# Patient Record
Sex: Female | Born: 1935 | Race: White | Hispanic: No | Smoking: Never smoker
Health system: Southern US, Community
[De-identification: ages and names within clinical notes are randomized; demographics above are authoritative.]

## PROBLEM LIST (undated history)

## (undated) DIAGNOSIS — I10 Essential (primary) hypertension: Secondary | ICD-10-CM

## (undated) DIAGNOSIS — I509 Heart failure, unspecified: Secondary | ICD-10-CM

## (undated) DIAGNOSIS — F0390 Unspecified dementia without behavioral disturbance: Secondary | ICD-10-CM

## (undated) DIAGNOSIS — K5900 Constipation, unspecified: Secondary | ICD-10-CM

## (undated) DIAGNOSIS — I519 Heart disease, unspecified: Secondary | ICD-10-CM

## (undated) HISTORY — DX: Heart disease, unspecified: I51.9

---

## 2010-03-31 DIAGNOSIS — I10 Essential (primary) hypertension: Secondary | ICD-10-CM | POA: Insufficient documentation

## 2010-09-21 DIAGNOSIS — I701 Atherosclerosis of renal artery: Secondary | ICD-10-CM | POA: Insufficient documentation

## 2010-11-04 DIAGNOSIS — E785 Hyperlipidemia, unspecified: Secondary | ICD-10-CM | POA: Insufficient documentation

## 2010-12-17 DIAGNOSIS — R739 Hyperglycemia, unspecified: Secondary | ICD-10-CM | POA: Insufficient documentation

## 2011-10-25 DIAGNOSIS — Z1239 Encounter for other screening for malignant neoplasm of breast: Secondary | ICD-10-CM | POA: Insufficient documentation

## 2012-08-02 DIAGNOSIS — I25118 Atherosclerotic heart disease of native coronary artery with other forms of angina pectoris: Secondary | ICD-10-CM | POA: Insufficient documentation

## 2013-07-31 DIAGNOSIS — N189 Chronic kidney disease, unspecified: Secondary | ICD-10-CM | POA: Insufficient documentation

## 2013-07-31 DIAGNOSIS — E559 Vitamin D deficiency, unspecified: Secondary | ICD-10-CM | POA: Insufficient documentation

## 2014-09-11 DIAGNOSIS — A0472 Enterocolitis due to Clostridium difficile, not specified as recurrent: Secondary | ICD-10-CM | POA: Insufficient documentation

## 2014-11-19 DIAGNOSIS — Z86010 Personal history of colonic polyps: Secondary | ICD-10-CM | POA: Insufficient documentation

## 2015-04-04 DIAGNOSIS — I2699 Other pulmonary embolism without acute cor pulmonale: Secondary | ICD-10-CM | POA: Insufficient documentation

## 2016-12-19 DIAGNOSIS — R7989 Other specified abnormal findings of blood chemistry: Secondary | ICD-10-CM | POA: Insufficient documentation

## 2016-12-22 DIAGNOSIS — J9801 Acute bronchospasm: Secondary | ICD-10-CM | POA: Insufficient documentation

## 2017-07-28 DIAGNOSIS — F419 Anxiety disorder, unspecified: Secondary | ICD-10-CM | POA: Insufficient documentation

## 2017-10-12 DIAGNOSIS — R440 Auditory hallucinations: Secondary | ICD-10-CM | POA: Insufficient documentation

## 2017-11-02 DIAGNOSIS — D509 Iron deficiency anemia, unspecified: Secondary | ICD-10-CM | POA: Insufficient documentation

## 2017-11-02 DIAGNOSIS — N183 Chronic kidney disease, stage 3 unspecified: Secondary | ICD-10-CM | POA: Insufficient documentation

## 2019-12-17 DIAGNOSIS — R609 Edema, unspecified: Secondary | ICD-10-CM | POA: Insufficient documentation

## 2019-12-17 DIAGNOSIS — R600 Localized edema: Secondary | ICD-10-CM | POA: Insufficient documentation

## 2019-12-17 DIAGNOSIS — R0609 Other forms of dyspnea: Secondary | ICD-10-CM | POA: Insufficient documentation

## 2020-02-14 DIAGNOSIS — I509 Heart failure, unspecified: Secondary | ICD-10-CM | POA: Insufficient documentation

## 2020-02-26 DIAGNOSIS — R197 Diarrhea, unspecified: Secondary | ICD-10-CM | POA: Insufficient documentation

## 2020-07-15 DIAGNOSIS — K52832 Lymphocytic colitis: Secondary | ICD-10-CM | POA: Insufficient documentation

## 2020-07-15 DIAGNOSIS — D6859 Other primary thrombophilia: Secondary | ICD-10-CM | POA: Insufficient documentation

## 2020-12-23 DIAGNOSIS — Z7409 Other reduced mobility: Secondary | ICD-10-CM | POA: Insufficient documentation

## 2020-12-27 DIAGNOSIS — R0902 Hypoxemia: Secondary | ICD-10-CM | POA: Insufficient documentation

## 2020-12-27 DIAGNOSIS — K602 Anal fissure, unspecified: Secondary | ICD-10-CM | POA: Insufficient documentation

## 2020-12-27 DIAGNOSIS — K5641 Fecal impaction: Secondary | ICD-10-CM | POA: Insufficient documentation

## 2021-03-21 ENCOUNTER — Encounter (HOSPITAL_COMMUNITY): Payer: Self-pay | Admitting: Emergency Medicine

## 2021-03-21 ENCOUNTER — Emergency Department (HOSPITAL_COMMUNITY)
Admission: EM | Admit: 2021-03-21 | Discharge: 2021-03-21 | Disposition: A | Discharge status: Home / Self Care | Payer: Medicare Other | Attending: Emergency Medicine | Admitting: Emergency Medicine

## 2021-03-21 ENCOUNTER — Other Ambulatory Visit: Payer: Self-pay

## 2021-03-21 ENCOUNTER — Emergency Department (HOSPITAL_COMMUNITY): Payer: Medicare Other

## 2021-03-21 DIAGNOSIS — Z79899 Other long term (current) drug therapy: Secondary | ICD-10-CM | POA: Insufficient documentation

## 2021-03-21 DIAGNOSIS — R001 Bradycardia, unspecified: Secondary | ICD-10-CM | POA: Diagnosis not present

## 2021-03-21 DIAGNOSIS — I110 Hypertensive heart disease with heart failure: Secondary | ICD-10-CM | POA: Insufficient documentation

## 2021-03-21 DIAGNOSIS — Y9289 Other specified places as the place of occurrence of the external cause: Secondary | ICD-10-CM | POA: Insufficient documentation

## 2021-03-21 DIAGNOSIS — S42214A Unspecified nondisplaced fracture of surgical neck of right humerus, initial encounter for closed fracture: Secondary | ICD-10-CM | POA: Diagnosis not present

## 2021-03-21 DIAGNOSIS — F0390 Unspecified dementia without behavioral disturbance: Secondary | ICD-10-CM | POA: Diagnosis not present

## 2021-03-21 DIAGNOSIS — W19XXXA Unspecified fall, initial encounter: Secondary | ICD-10-CM | POA: Diagnosis not present

## 2021-03-21 DIAGNOSIS — I509 Heart failure, unspecified: Secondary | ICD-10-CM | POA: Diagnosis not present

## 2021-03-21 DIAGNOSIS — S4991XA Unspecified injury of right shoulder and upper arm, initial encounter: Secondary | ICD-10-CM | POA: Diagnosis present

## 2021-03-21 HISTORY — DX: Essential (primary) hypertension: I10

## 2021-03-21 HISTORY — DX: Unspecified dementia, unspecified severity, without behavioral disturbance, psychotic disturbance, mood disturbance, and anxiety: F03.90

## 2021-03-21 HISTORY — DX: Heart failure, unspecified: I50.9

## 2021-03-21 HISTORY — DX: Constipation, unspecified: K59.00

## 2021-03-21 LAB — CBC WITH DIFFERENTIAL/PLATELET
Abs Immature Granulocytes: 0.05 10*3/uL (ref 0.00–0.07)
Basophils Absolute: 0.1 10*3/uL (ref 0.0–0.1)
Basophils Relative: 1 %
Eosinophils Absolute: 0.1 10*3/uL (ref 0.0–0.5)
Eosinophils Relative: 1 %
HCT: 40.3 % (ref 36.0–46.0)
Hemoglobin: 12.8 g/dL (ref 12.0–15.0)
Immature Granulocytes: 1 %
Lymphocytes Relative: 13 %
Lymphs Abs: 1.4 10*3/uL (ref 0.7–4.0)
MCH: 27.8 pg (ref 26.0–34.0)
MCHC: 31.8 g/dL (ref 30.0–36.0)
MCV: 87.4 fL (ref 80.0–100.0)
Monocytes Absolute: 0.7 10*3/uL (ref 0.1–1.0)
Monocytes Relative: 7 %
Neutro Abs: 8.2 10*3/uL — ABNORMAL HIGH (ref 1.7–7.7)
Neutrophils Relative %: 77 %
Platelets: 219 10*3/uL (ref 150–400)
RBC: 4.61 MIL/uL (ref 3.87–5.11)
RDW: 15.3 % (ref 11.5–15.5)
WBC: 10.5 10*3/uL (ref 4.0–10.5)
nRBC: 0 % (ref 0.0–0.2)

## 2021-03-21 LAB — URINALYSIS, ROUTINE W REFLEX MICROSCOPIC
Bilirubin Urine: NEGATIVE
Glucose, UA: NEGATIVE mg/dL
Hgb urine dipstick: NEGATIVE
Ketones, ur: NEGATIVE mg/dL
Leukocytes,Ua: NEGATIVE
Nitrite: NEGATIVE
Protein, ur: 30 mg/dL — AB
Specific Gravity, Urine: 1.006 (ref 1.005–1.030)
pH: 7 (ref 5.0–8.0)

## 2021-03-21 LAB — COMPREHENSIVE METABOLIC PANEL
ALT: 25 U/L (ref 0–44)
AST: 69 U/L — ABNORMAL HIGH (ref 15–41)
Albumin: 3.6 g/dL (ref 3.5–5.0)
Alkaline Phosphatase: 59 U/L (ref 38–126)
Anion gap: 7 (ref 5–15)
BUN: 21 mg/dL (ref 8–23)
CO2: 32 mmol/L (ref 22–32)
Calcium: 8.8 mg/dL — ABNORMAL LOW (ref 8.9–10.3)
Chloride: 101 mmol/L (ref 98–111)
Creatinine, Ser: 1 mg/dL (ref 0.44–1.00)
GFR, Estimated: 53 mL/min — ABNORMAL LOW (ref >60)
Glucose, Bld: 156 mg/dL — ABNORMAL HIGH (ref 70–99)
Potassium: 3.6 mmol/L (ref 3.5–5.1)
Sodium: 140 mmol/L (ref 135–145)
Total Bilirubin: 0.3 mg/dL (ref 0.3–1.2)
Total Protein: 6.7 g/dL (ref 6.5–8.1)

## 2021-03-21 LAB — PROTIME-INR
INR: 1 (ref 0.8–1.2)
Prothrombin Time: 12.8 seconds (ref 11.4–15.2)

## 2021-03-21 LAB — APTT: aPTT: 26 seconds (ref 24–36)

## 2021-03-21 LAB — CBG MONITORING, ED: Glucose-Capillary: 154 mg/dL — ABNORMAL HIGH (ref 70–99)

## 2021-03-21 MED ORDER — HYDROCODONE-ACETAMINOPHEN 5-325 MG PO TABS: 1.0000 | ORAL_TABLET | Freq: Four times a day (QID) | ORAL | 0 refills | Status: DC | PRN

## 2021-03-21 MED ORDER — FENTANYL CITRATE (PF) 100 MCG/2ML IJ SOLN
25.0000 ug | Freq: Once | INTRAMUSCULAR | Status: AC
  Administered 2021-03-21: 25 ug via INTRAMUSCULAR
  Filled 2021-03-21: qty 2

## 2021-03-21 MED ORDER — FENTANYL CITRATE (PF) 100 MCG/2ML IJ SOLN: 25.0000 ug | Freq: Once | INTRAMUSCULAR | Status: DC

## 2021-03-21 NOTE — ED Triage Notes (Signed)
Patient brought in by son. Per son patient fell today while getting out of shower and is c/o right shoulder pain. Denies hitting head or LOC but does take anticoagulants. No pain noted with palpation of humerus or ulna/radius. Pain noted with ROM, patient unable to lift arm.

## 2021-03-21 NOTE — ED Provider Notes (Signed)
[facility] EMERGENCY DEPARTMENT Provider Note   CSN: [id number] Arrival date & time: 03/21/21  1021     History Chief Complaint  Patient presents with   Marletta Lor    Terri Gonzales is a 87 y.o. female.  HPI  87 year old female with a history of dementia, hypertension, CHF, CKD, colitis, HLD, anemia, PE, constipation, who presents to the emergency department today for evaluation of a fall.  She is here with her son who provides the history.  He states that the patient is staying with him for the next 10 days.  She did lives in [place] with his sibling however they are on vacation.  Patient was in the shower earlier today when they heard a loud noise.  They found her in the shower and she apparently had fallen.  The patient reportedly has a history of frequent falls but she usually gets back up on her own and does not have any significant injuries.  At this time she is complaining of right shoulder pain and that is her only complaint.  She is had no recent illnesses including no fevers, vomiting, diarrhea, cough or other concerns from the family.  They do state that she is anticoagulated but they are not sure what medication she is on.  Son states she is at her mental baseline currently   Past Medical History:  Diagnosis Date   CHF (congestive heart failure) (HCC)    Constipation    Dementia (HCC)    Hypertension     There are no problems to display for this patient.   History reviewed. No pertinent surgical history.   OB History     Gravida      Para      Term      Preterm      AB      Living  2      SAB      IAB      Ectopic      Multiple      Live Births              History reviewed. No pertinent family history.  Social History   Tobacco Use   Smoking status: Never   Smokeless tobacco: Never  Vaping Use   Vaping Use: Never used  Substance Use Topics   Alcohol use: Never   Drug use: Never    Home Medications Prior to Admission  medications   Medication Sig Start Date End Date Taking? Authorizing Provider  amLODipine (NORVASC) 10 MG tablet Take 1 tablet by mouth daily. 02/12/21  Yes [provider]  ARIPiprazole (ABILIFY) 30 MG tablet Take 30 mg by mouth daily. 03/16/21  Yes [provider]  atorvastatin (LIPITOR) 40 MG tablet Take 1 tablet by mouth at bedtime. 02/12/21  Yes [provider]  budesonide (ENTOCORT EC) 3 MG 24 hr capsule Take 6 mg by mouth every morning. 01/05/21  Yes [provider]  docusate sodium (COLACE) 100 MG capsule Take 100 mg by mouth 2 (two) times daily. 03/03/21  Yes [provider]  escitalopram (LEXAPRO) 10 MG tablet Take 1 tablet by mouth daily. 02/12/21  Yes [provider]  furosemide (LASIX) 20 MG tablet Take 20 mg by mouth daily. 02/10/21  Yes [provider]  HYDROcodone-acetaminophen (NORCO/VICODIN) 5-325 MG tablet Take 1 tablet by mouth every 6 (six) hours as needed. 03/21/21  Yes Johanne Mcglade S, PA-C  lisinopril (ZESTRIL) 20 MG tablet Take 1 tablet by mouth 2 (  two) times daily. 02/12/21  Yes [provider]  Melatonin 10 MG TABS Take 1 capsule by mouth at bedtime.   Yes [provider]  Multiple Vitamin (QUINTABS) TABS Take 1 tablet by mouth daily.   Yes [provider]  pantoprazole (PROTONIX) 40 MG tablet Take 1 tablet by mouth daily. 03/03/21  Yes [provider]  Probiotic Product (PHILLIPS COLON HEALTH) CAPS Take 1 capsule by mouth daily. 08/02/12  Yes [provider]    Allergies    Patient has no known allergies.  Review of Systems   Review of Systems  Unable to perform ROS: Dementia  Musculoskeletal:        Right shoulder pain   Physical Exam Updated Vital Signs BP (!) 209/54   Pulse (!) 54   Temp 98 F (36.7 C) (Oral)   Resp 16   Ht 5' 1.5" (1.562 m)   Wt 52.2 kg   SpO2 96%   BMI 21.38 kg/m   Physical Exam Vitals and nursing note reviewed.  Constitutional:       General: She is not in acute distress.    Appearance: She is well-developed.  HENT:     Head: Normocephalic and atraumatic.  Eyes:     Conjunctiva/sclera: Conjunctivae normal.  Cardiovascular:     Rate and Rhythm: Normal rate and regular rhythm.     Heart sounds: Normal heart sounds. No murmur heard. Pulmonary:     Effort: Pulmonary effort is normal. No respiratory distress.     Breath sounds: Normal breath sounds. No wheezing, rhonchi or rales.  Abdominal:     General: Bowel sounds are normal.     Palpations: Abdomen is soft.     Tenderness: There is no abdominal tenderness.  Musculoskeletal:     Cervical back: Neck supple.     Comments: Trace ble edema. No pain with active/passive rom of the ble. No ttp to the bilat hips. TTP noted to the right shoulder and proximal humerus. Distal pulses are strong and sensation is intact. No midline ttp to the ctl spine.  Skin:    General: Skin is warm and dry.  Neurological:     Mental Status: She is alert.     Comments: Alert, demented. Oriented to self. Cranial nerves II-XI intact. Moving all extremities. Able to follow commands    ED Results / Procedures / Treatments   Labs (all labs ordered are listed, but only abnormal results are displayed) Labs Reviewed  CBC WITH DIFFERENTIAL/PLATELET - Abnormal; Notable for the following components:      Result Value   Neutro Abs 8.2 (*)    All other components within normal limits  COMPREHENSIVE METABOLIC PANEL - Abnormal; Notable for the following components:   Glucose, Bld 156 (*)    Calcium 8.8 (*)    AST 69 (*)    GFR, Estimated 53 (*)    All other components within normal limits  URINALYSIS, ROUTINE W REFLEX MICROSCOPIC - Abnormal; Notable for the following components:   Color, Urine STRAW (*)    Protein, ur 30 (*)    Bacteria, UA RARE (*)    All other components within normal limits  CBG MONITORING, ED - Abnormal; Notable for the following components:   Glucose-Capillary 154 (*)     All other components within normal limits  PROTIME-INR  APTT    EKG EKG Interpretation  Date/Time:  Saturday March 21 2021 11:47:49 EDT Ventricular Rate:  56 PR Interval:    QRS Duration:  94 QT Interval:  487 QTC Calculation: 470 R Axis:   49 Text Interpretation: Sinus bradycardia Abnormal R-wave progression, early transition Consider left ventricular hypertrophy No old tracing to compare Confirmed by Eber HongMiller, Brian ([id number]) on 03/21/2021 12:08:28 PM  Radiology DG Shoulder Right  Result Date: 03/21/2021 CLINICAL DATA:  Status post fall. EXAM: RIGHT SHOULDER - 2+ VIEW COMPARISON:  None. FINDINGS: No definite fracture line is visualized, however there is suspected impacted fracture of the right humeral neck. IMPRESSION: 1. Possible impacted fracture of the right humeral neck. If confirmed by point tenderness, CT of the right shoulder may be considered. Electronically Signed   By: Ted Mcalpineobrinka  Dimitrova M.D.   On: 03/21/2021 12:50   CT Head Wo Contrast  Result Date: 03/21/2021 CLINICAL DATA:  Head trauma.  Recent fall. EXAM: CT HEAD WITHOUT CONTRAST TECHNIQUE: Contiguous axial images were obtained from the base of the skull through the vertex without intravenous contrast. COMPARISON:  None. FINDINGS: Brain: No evidence of acute infarction, hemorrhage, hydrocephalus, extra-axial collection or mass lesion/mass effect. Mild low-density in the white matter is suggestive for chronic changes. Age appropriate atrophy. Vascular: No hyperdense vessel or unexpected calcification. Skull: Normal. Negative for fracture or focal lesion. Sinuses/Orbits: Mastoid air cells are aerated. Small amount of mucosal disease in the right sphenoid sinus. Other: None. IMPRESSION: 1. No acute intracranial abnormality. 2. Small amount of low-density in the white matter. Findings are suggestive for chronic small vessel ischemic changes. 3. Mild disease in the right sphenoid sinus. Electronically Signed   By: Richarda OverlieAdam  Henn M.D.    On: 03/21/2021 12:39    Procedures Procedures   Medications Ordered in ED Medications  fentaNYL (SUBLIMAZE) injection 25 mcg (25 mcg Intramuscular Given 03/21/21 1225)    ED Course  I have reviewed the triage vital signs and the nursing notes.  Pertinent labs & imaging results that were available during my care of the patient were reviewed by me and considered in my medical decision making (see chart for details).    MDM Rules/Calculators/A&P                          87 y/o female with hx dementia presenting for eval of unwitnessed fall  Reviewed/interpreted labs CBC unremarkable CMP with mildly elevated ast, otherwise reassuring Coags wnl UA with mild proteinuria, 6-10 wbc and rare bacteria. Low suspicion for uti  EKG - Sinus bradycardia Abnormal R-wave progression, early transition Consider left ventricular hypertrophy No old tracing to compare   Reviewed/interpreted imaging CT head - . No acute intracranial abnormality. 2. Small amount of low-density in the white matter. Findings are suggestive for chronic small vessel ischemic changes. 3. Mild disease in the right sphenoid sinus.  Xray right shoulder - likely impacted fracture of the right humeral neck.   - shoulder immobilization with sling placed  Pt with mechanical fall with right humeral neck fx. No other injuries and lab work/ekg is reassuring. Appears stable for d/c. Discussed results with son at bedside.  Discussed that I will give her referral to local orthopedic doctor while she is in town if he prefers to have her follow-up here however if he prefers to have her follow-up in her [place] then he will need to coordinate following up the patient's primary care provider and get a new referral to an orthopedic doctor there that she does not already have 1.  Will give Rx for pain medications.  Advised on return precautions.  He voices understanding of plan and reasons to return.  Questions answered.   Patient stable for discharge.  Final Clinical Impression(s) / ED Diagnoses Final diagnoses:  Fall, initial encounter  Closed nondisplaced fracture of surgical neck of right humerus, unspecified fracture morphology, initial encounter    Rx / DC Orders ED Discharge Orders          Ordered    HYDROcodone-acetaminophen (NORCO/VICODIN) 5-325 MG tablet  Every 6 hours PRN        03/21/21 1415             Quinn Quam S, PA-C 03/21/21 1419    Eber Hong, MD 03/22/21 [id number]

## 2021-03-21 NOTE — Discharge Instructions (Addendum)
Prescription given for norco. Take medication as directed and do not operate machinery, drive a car, or work while taking this medication as it can make you drowsy.   The patient was given a referral to an orthopedic doctor located locally here and in the [place] area.  She may follow-up with this orthopedic doctor or, if preferred you can call the patient's primary physician or her orthopedic physician at home to have her be scheduled for a follow-up appointment within the next 1 to 2 weeks for further treatment of her fracture.  Please return to the emergency department for any new or worsening symptoms in the meantime.

## 2021-03-21 NOTE — ED Triage Notes (Signed)
Medical screening done prior to patient being triaged.

## 2021-03-21 NOTE — ED Triage Notes (Signed)
EDPA in room performing medical screening. Son in room, calling to get patient's allergies and hx from sister.

## 2021-03-25 ENCOUNTER — Ambulatory Visit (INDEPENDENT_AMBULATORY_CARE_PROVIDER_SITE_OTHER): Payer: Medicare Other | Admitting: Orthopedic Surgery

## 2021-03-25 ENCOUNTER — Other Ambulatory Visit: Payer: Self-pay

## 2021-03-25 ENCOUNTER — Encounter: Payer: Self-pay | Admitting: Orthopedic Surgery

## 2021-03-25 VITALS — BP 200/62 | HR 60 | Ht 61.0 in | Wt 118.4 lb

## 2021-03-25 DIAGNOSIS — S42294A Other nondisplaced fracture of upper end of right humerus, initial encounter for closed fracture: Secondary | ICD-10-CM

## 2021-03-25 MED ORDER — HYDROCODONE-ACETAMINOPHEN 5-325 MG PO TABS: 1.0000 | ORAL_TABLET | Freq: Four times a day (QID) | ORAL | 0 refills | Status: AC | PRN

## 2021-03-25 NOTE — Progress Notes (Signed)
NEW PROBLEM//OFFICE VISIT  (Note no fracture care was charged as the patient is traveling to [place] and will be seen by another orthopedist  Summary assessment and plan:   87 year old nondisplaced transverse fracture surgical neck right humerus  Sling 3 weeks then therapy after x-ray  Chief Complaint  Patient presents with   New Patient (Initial Visit)   Shoulder Injury    Fx rt shoulder   HPI   MEDICAL DECISION MAKING  A.  Encounter Diagnosis  Name Primary?   Other closed nondisplaced fracture of proximal end of right humerus, initial encounter Yes    B. DATA ANALYSED:   IMAGING: Interpretation of images: Images were taken at [facility] on June 18  I am interpreting this as a nondisplaced proximal humerus fracture at the surgical neck  Orders: No new orders  Outside records reviewed: ER records   C. MANAGEMENT   Nonoperative management  Meds ordered this encounter  Medications   HYDROcodone-acetaminophen (NORCO/VICODIN) 5-325 MG tablet    Sig: Take 1 tablet by mouth every 6 (six) hours as needed for up to 5 days for moderate pain.    Dispense:  20 tablet    Refill:  0     BP (!) 200/62   Pulse 60   Ht 5\' 1"  (1.549 m)   Wt 118 lb 6.4 oz (53.7 kg)   BMI 22.37 kg/m    General appearance: Well-developed well-nourished no gross deformities  Cardiovascular normal pulse and perfusion normal color without edema  Neurologically no sensation loss or deficits or pathologic reflexes  Psychological: Awake alert and disoriented x3 mood and affect normal  Skin no lacerations or ulcerations no nodularity no palpable masses, no erythema or nodularity  Musculoskeletal: Right shoulder mild tenderness some ecchymosis moves the hand okay neurovascular exam intact   ROS Constipation no numbness or tingling  Past Medical History:  Diagnosis Date   CHF (congestive heart failure) (HCC)    Constipation    Dementia (HCC)    Heart disease     Hypertension     No past surgical history on file.  No family history on file. Social History   Tobacco Use   Smoking status: Never   Smokeless tobacco: Never  Vaping Use   Vaping Use: Never used  Substance Use Topics   Alcohol use: Never   Drug use: Never    No Known Allergies  Current Meds  Medication Sig   amLODipine (NORVASC) 10 MG tablet Take 1 tablet by mouth daily.   ARIPiprazole (ABILIFY) 30 MG tablet Take 30 mg by mouth daily.   atorvastatin (LIPITOR) 40 MG tablet Take 1 tablet by mouth at bedtime.   budesonide (ENTOCORT EC) 3 MG 24 hr capsule Take 6 mg by mouth every morning.   docusate sodium (COLACE) 100 MG capsule Take 100 mg by mouth 2 (two) times daily.   escitalopram (LEXAPRO) 10 MG tablet Take 1 tablet by mouth daily.   furosemide (LASIX) 20 MG tablet Take 20 mg by mouth daily.   HYDROcodone-acetaminophen (NORCO/VICODIN) 5-325 MG tablet Take 1 tablet by mouth every 6 (six) hours as needed for up to 5 days for moderate pain.   lisinopril (ZESTRIL) 20 MG tablet Take 1 tablet by mouth 2 (two) times daily.   Melatonin 10 MG TABS Take 1 capsule by mouth at bedtime.   Multiple Vitamin (QUINTABS) TABS Take 1 tablet by mouth daily.   pantoprazole (PROTONIX) 40 MG tablet Take 1 tablet by mouth daily.  Probiotic Product (PHILLIPS COLON HEALTH) CAPS Take 1 capsule by mouth daily.   [DISCONTINUED] HYDROcodone-acetaminophen (NORCO/VICODIN) 5-325 MG tablet Take 1 tablet by mouth every 6 (six) hours as needed.        Fuller Canada, MD  03/25/2021 10:39 AM

## 2021-03-25 NOTE — Patient Instructions (Addendum)
Recommended treatment  See orthopedic surgeon in 2 weeks for x-ray  Normal treatment is 2 to 3 weeks in a sling and then physical therapy  Take the hydrocodone at night use extra strength Tylenol during the day

## 2022-08-23 IMAGING — CT CT HEAD W/O CM
3 series · 15 of 47 positions shown, 18 images · non-contrast
Comparison: None.

CLINICAL DATA: Head trauma.  Recent fall.

EXAM:
CT HEAD WITHOUT CONTRAST
TECHNIQUE: Contiguous axial images were obtained from the base of the skull
through the vertex without intravenous contrast.

[Series 2: head w o · axial · 0.44mm/px · z∈[+194,+324]mm · 9 of 32 slices shown, 12 images]
[im 3/32  brain]
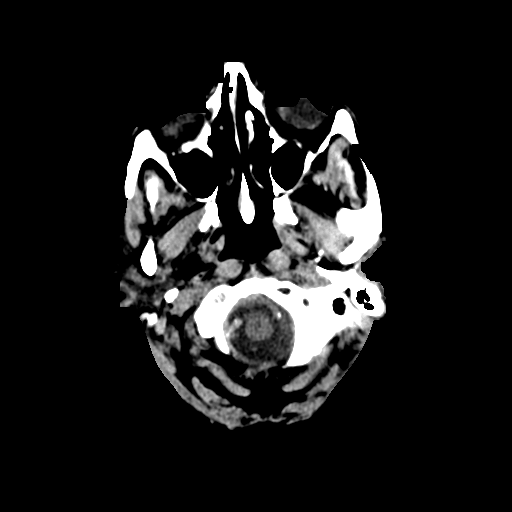
[im 3/32  bone]
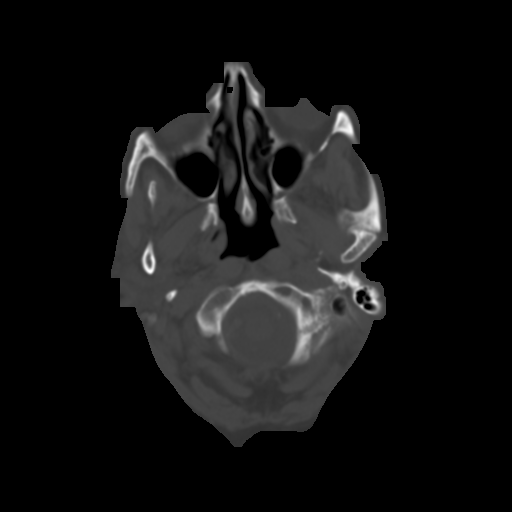
[im 6/32  brain]
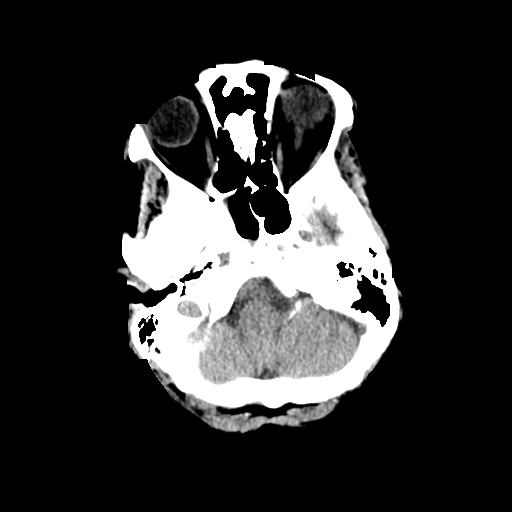
[im 9/32  brain]
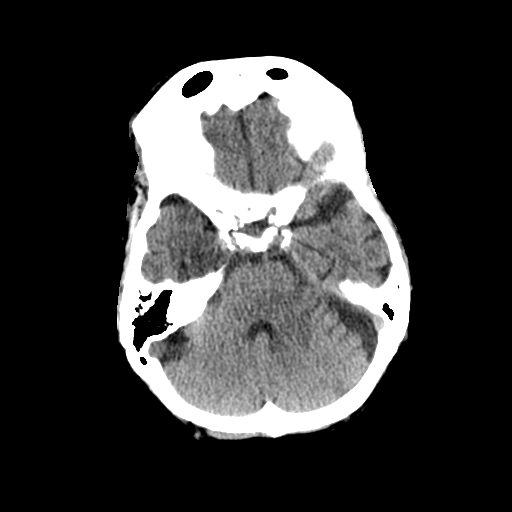
[im 12/32  brain]
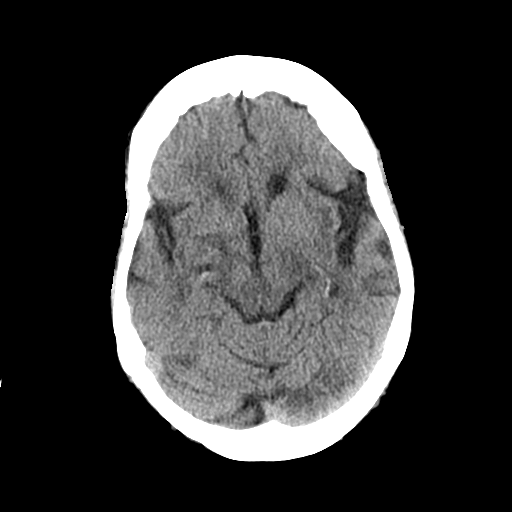
[im 17/32  brain]
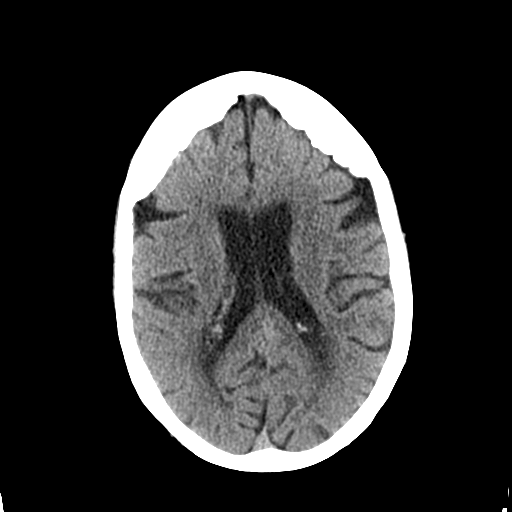
[im 17/32  bone]
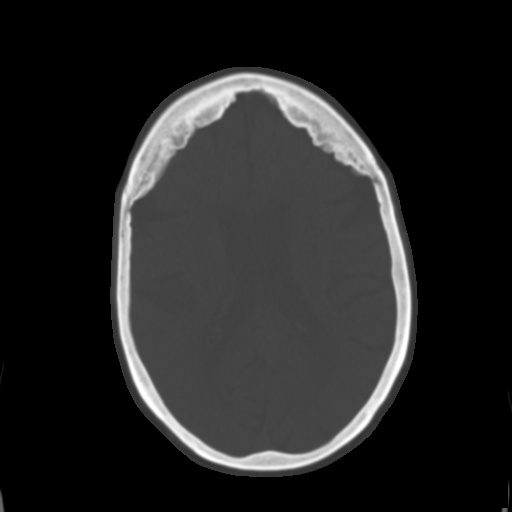
[im 20/32  brain]
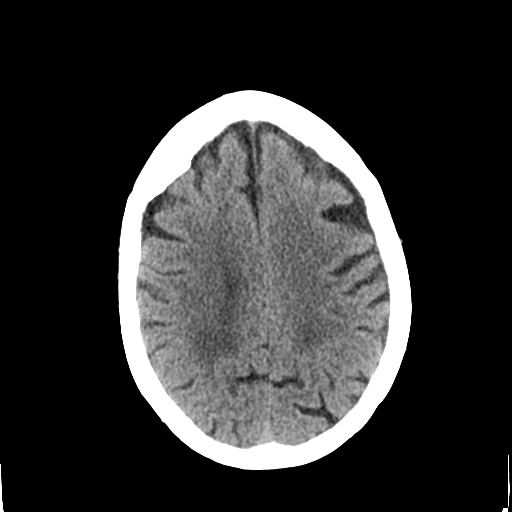
[im 23/32  brain]
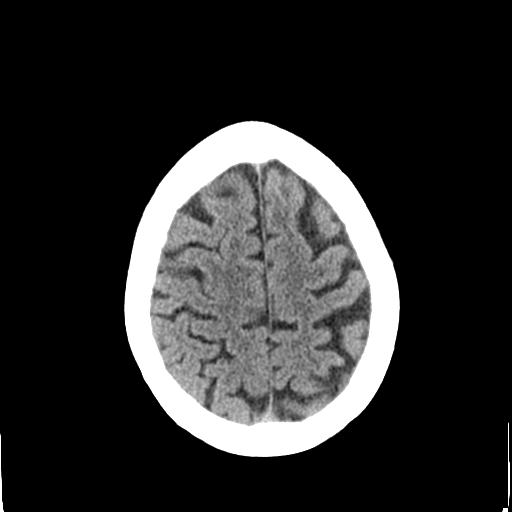
[im 26/32  brain]
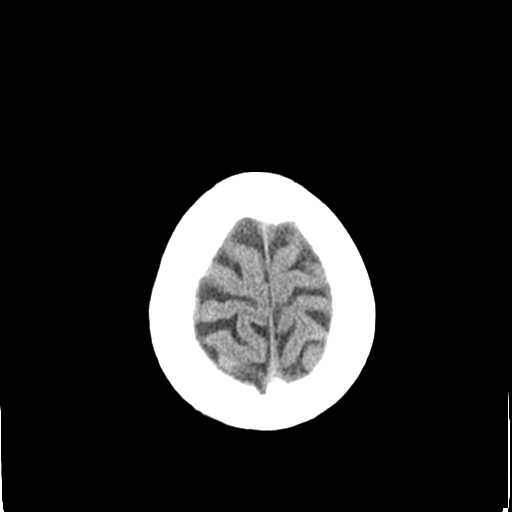
[im 29/32  brain]
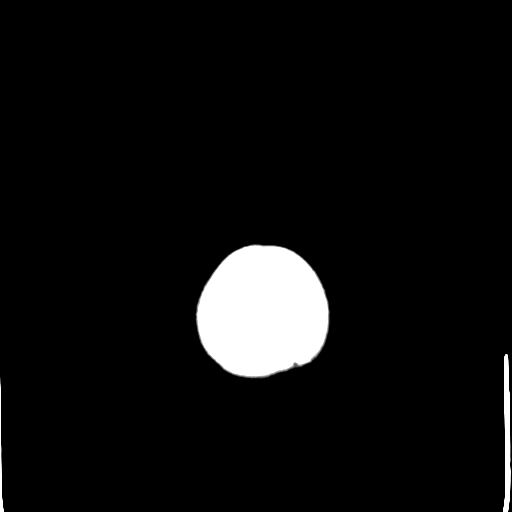
[im 29/32  bone]
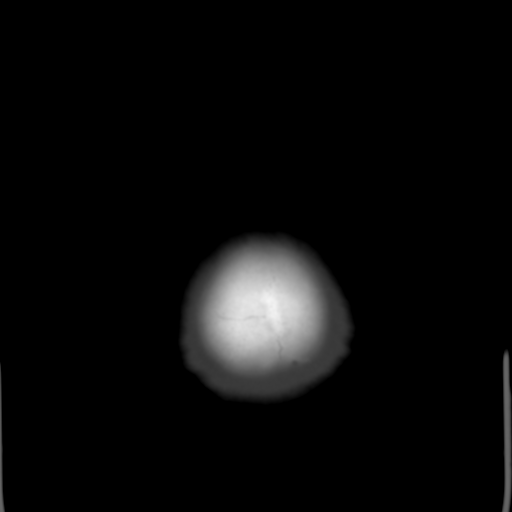

[Series 4: coronal soft · coronal · 0.33mm/px · 3 of 71 slices shown]
[im 24/71  brain]
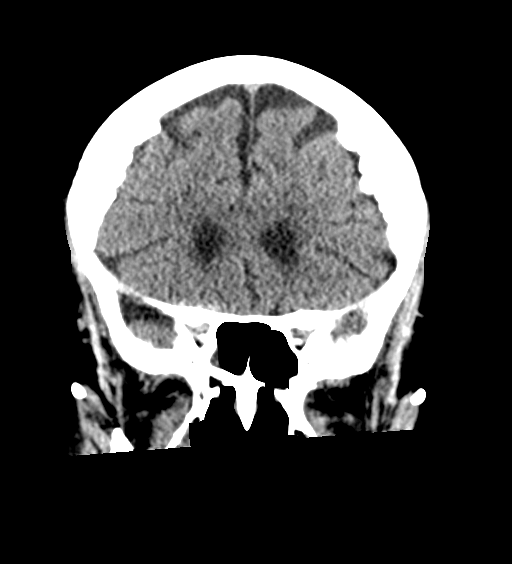
[im 32/71  brain]
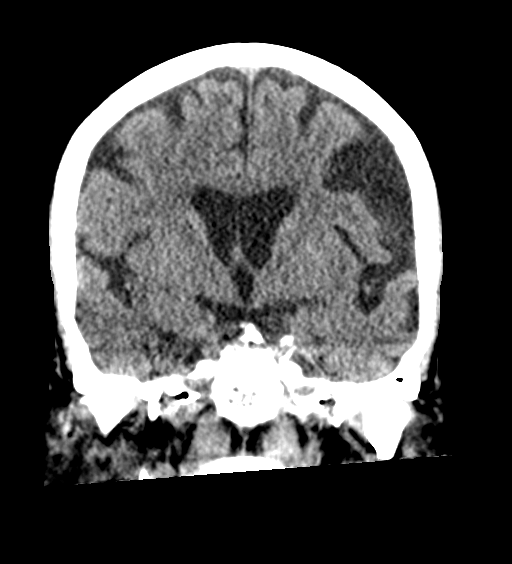
[im 39/71  brain]
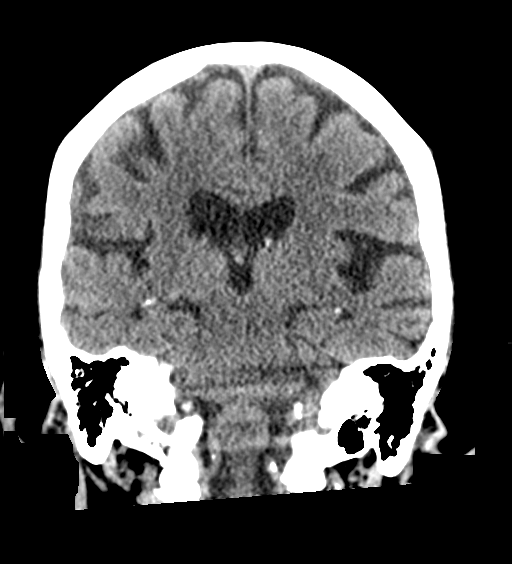

[Series 5: sagittal soft · sagittal · 0.34mm/px · 3 of 52 slices shown]
[im 18/52  brain]
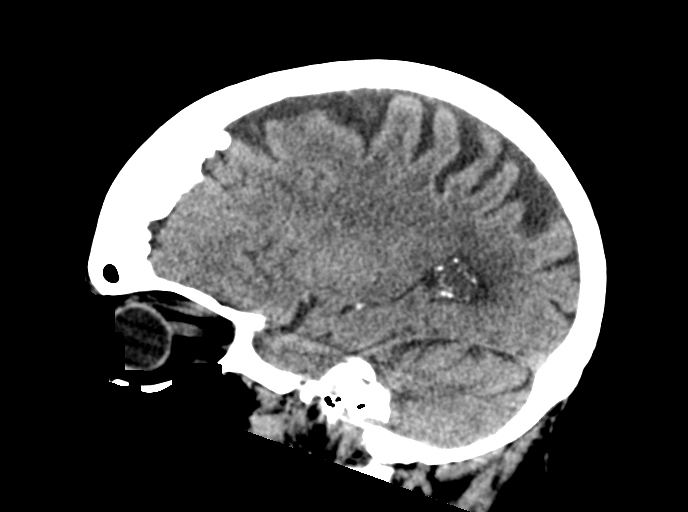
[im 26/52  brain]
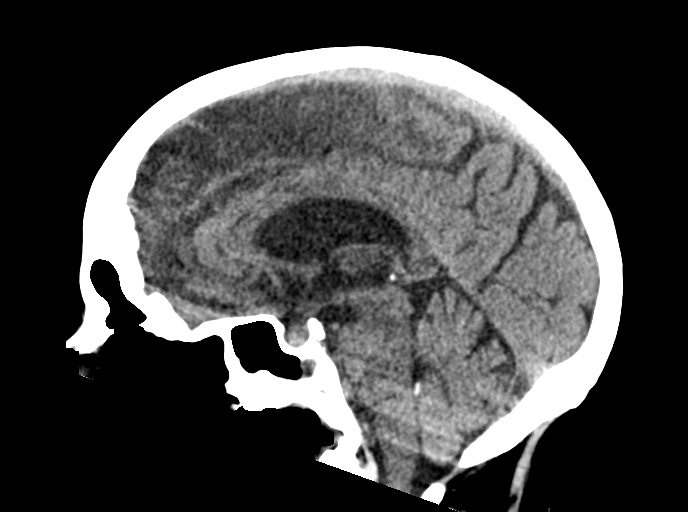
[im 35/52  brain]
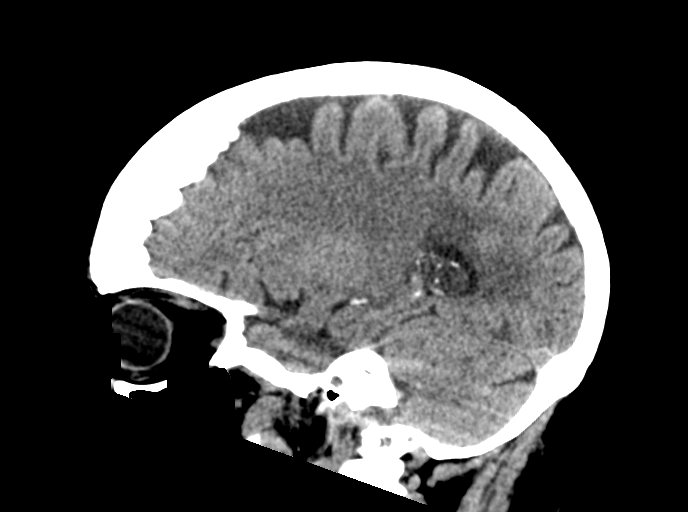

[15 of 47 positions shown; findings below may reference images not displayed]

FINDINGS: Brain: No evidence of acute infarction, hemorrhage, hydrocephalus,
extra-axial collection or mass lesion/mass effect. Mild low-density
in the white matter is suggestive for chronic changes. Age
appropriate atrophy.

Vascular: No hyperdense vessel or unexpected calcification.

Skull: Normal. Negative for fracture or focal lesion.

Sinuses/Orbits: Mastoid air cells are aerated. Small amount of
mucosal disease in the right sphenoid sinus.

Other: None.
IMPRESSION: 1. No acute intracranial abnormality.
2. Small amount of low-density in the white matter. Findings are
suggestive for chronic small vessel ischemic changes.
3. Mild disease in the right sphenoid sinus.

## 2022-08-23 IMAGING — DX DG SHOULDER 2+V*R*
2 series · 2 of 2 positions shown · non-contrast
Comparison: None.

CLINICAL DATA: Status post fall.

EXAM:
RIGHT SHOULDER - 2+ VIEW

[shoulder grashey]
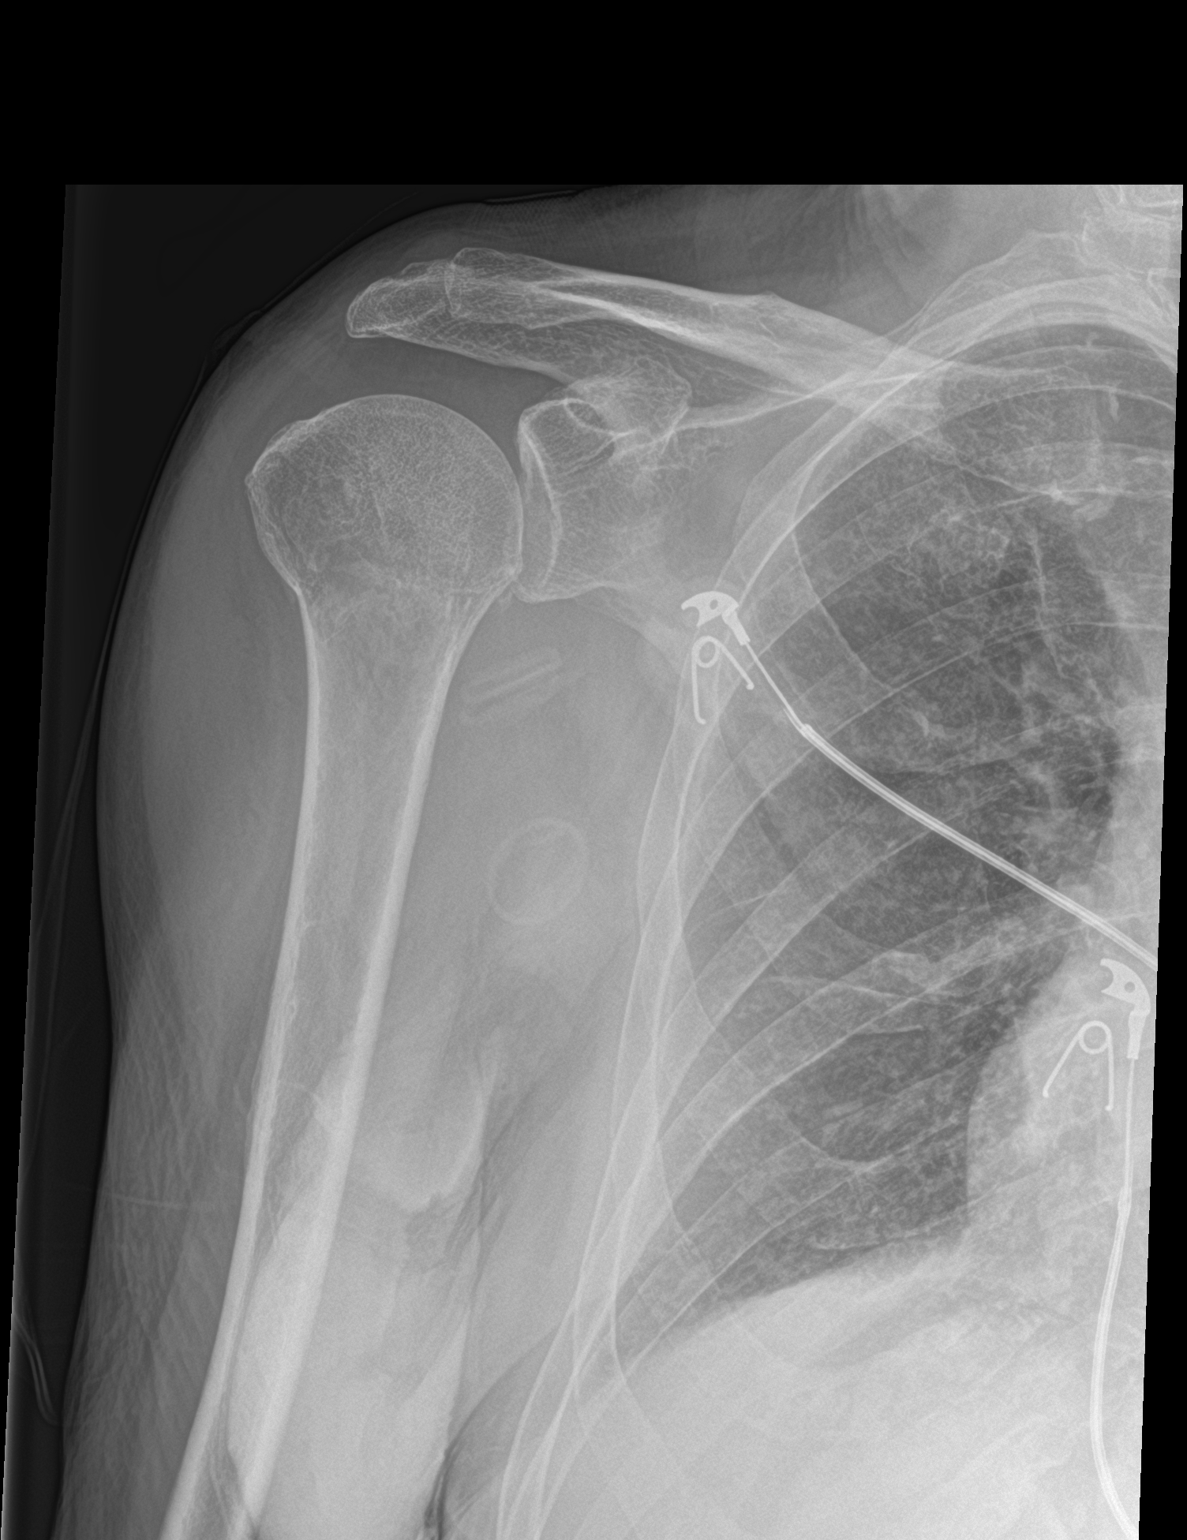

[shoulder y view]
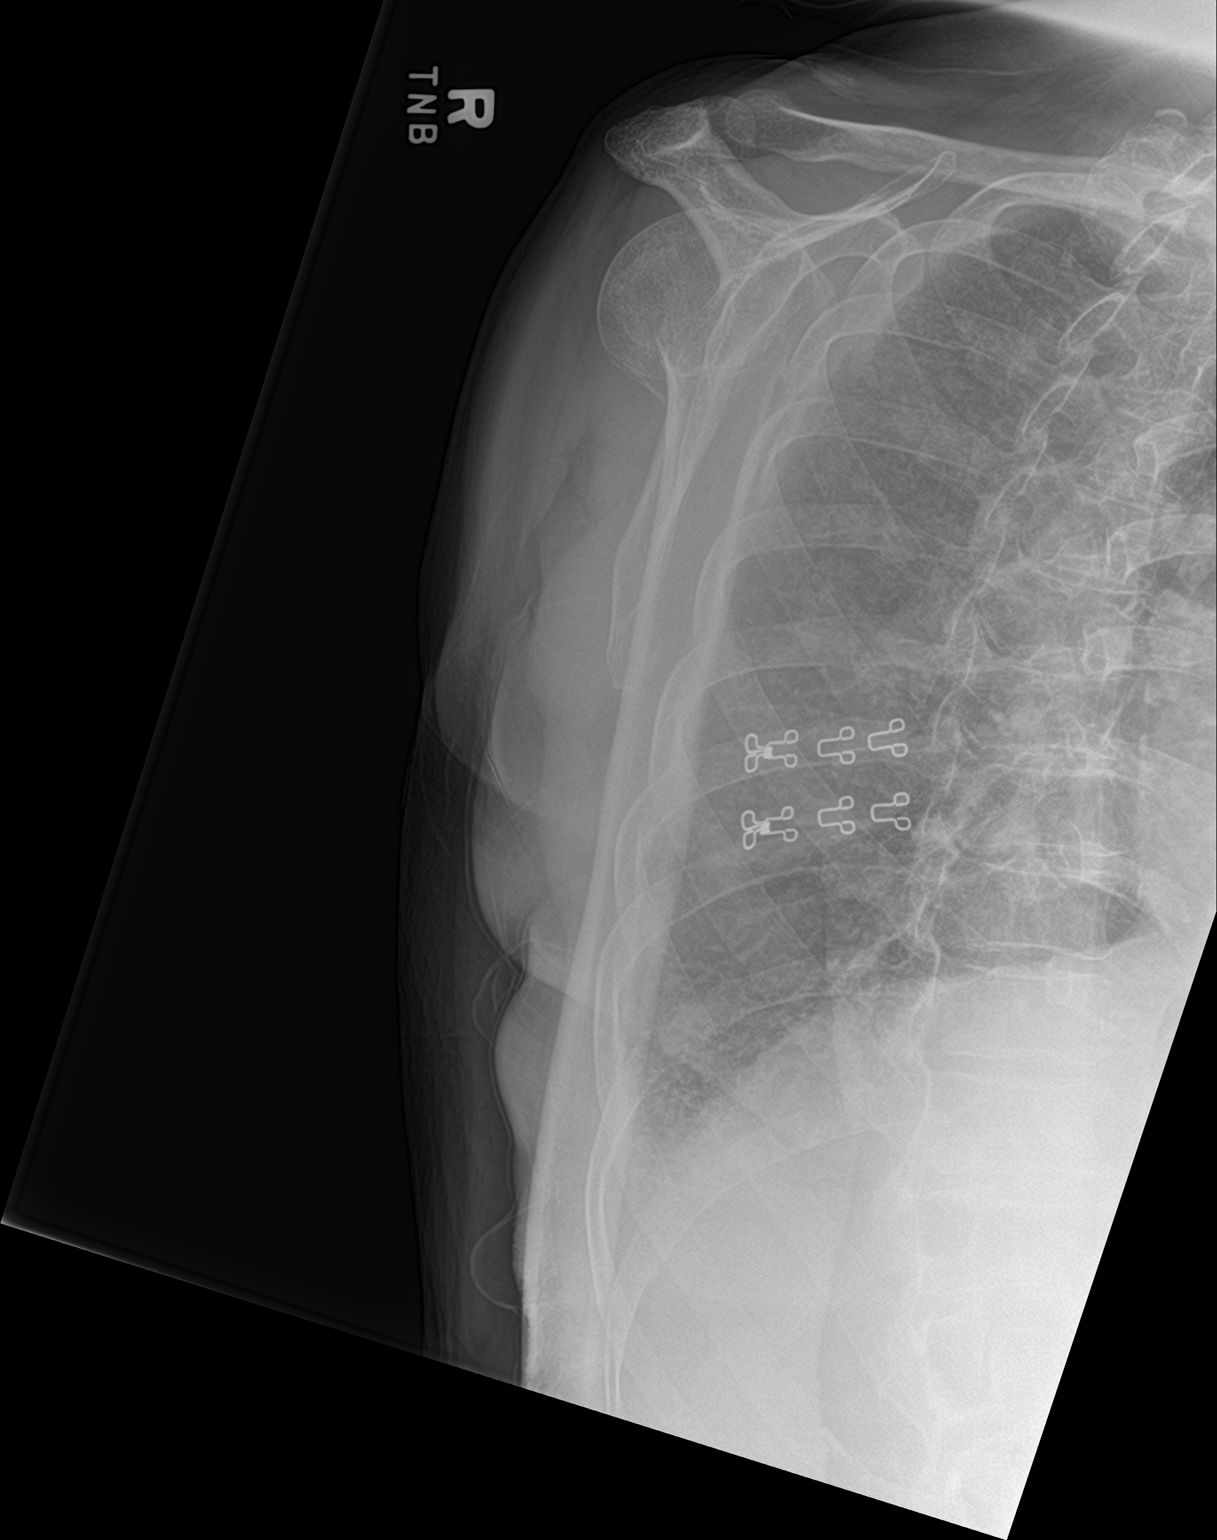

[2 of 2 positions shown; findings below may reference images not displayed]

FINDINGS: No definite fracture line is visualized, however there is suspected
impacted fracture of the right humeral neck.
IMPRESSION: 1. Possible impacted fracture of the right humeral neck. If
confirmed by point tenderness, CT of the right shoulder may be
considered.

## 2022-10-04 DEATH — deceased
